# Patient Record
Sex: Male | Born: 2005 | Race: White | Hispanic: No | Marital: Single | Smoking: Never smoker
Health system: Southern US, Community
[De-identification: ages and names within clinical notes are randomized; demographics above are authoritative.]

## PROBLEM LIST (undated history)

## (undated) DIAGNOSIS — R17 Unspecified jaundice: Secondary | ICD-10-CM

## (undated) DIAGNOSIS — K5900 Constipation, unspecified: Secondary | ICD-10-CM

## (undated) DIAGNOSIS — Z8489 Family history of other specified conditions: Secondary | ICD-10-CM

## (undated) DIAGNOSIS — R109 Unspecified abdominal pain: Secondary | ICD-10-CM

## (undated) DIAGNOSIS — G43909 Migraine, unspecified, not intractable, without status migrainosus: Secondary | ICD-10-CM

## (undated) DIAGNOSIS — K921 Melena: Secondary | ICD-10-CM

## (undated) HISTORY — DX: Constipation, unspecified: K59.00

## (undated) HISTORY — DX: Unspecified jaundice: R17

---
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
## 2023-04-23 ENCOUNTER — Telehealth (INDEPENDENT_AMBULATORY_CARE_PROVIDER_SITE_OTHER): Payer: Self-pay | Admitting: Pediatrics

## 2023-04-23 DIAGNOSIS — G43001 Migraine without aura, not intractable, with status migrainosus: Secondary | ICD-10-CM

## 2023-04-23 NOTE — Telephone Encounter (Signed)
  Name of who is calling: Papania,Donna   Caller's Relationship to Patient: Earlie Lou contact number: [phone]   Provider they see: Carlyon Prows  Reason for call: patient's mother called requesting the following prescriptions be sent to [facility] in [place] on [address]     PRESCRIPTION REFILL ONLY  Name of prescription: Topiramate ER, Propranolol ER   Pharmacy: [facility]

## 2023-04-26 MED ORDER — TOPIRAMATE ER 100 MG PO SPRINKLE CAP24: 100.0000 mg | EXTENDED_RELEASE_CAPSULE | Freq: Every day | ORAL | 5 refills | Status: DC

## 2023-04-26 MED ORDER — PROPRANOLOL HCL ER 60 MG PO CP24
ORAL_CAPSULE | ORAL | 2 refills | Status: AC
Start: 2023-04-26 — End: ?

## 2023-04-26 NOTE — Telephone Encounter (Signed)
 Referral placed to adult neuro, [facility]. Please let mother know refills provided and location of referral up to date. Thanks!

## 2023-04-26 NOTE — Telephone Encounter (Signed)
 Patients mother called to inquire about referral. According to her she received a referral to a [place] neurologist. She refused that referral on the basis of the patient going to [employer] for college and requested a referral to be sent to the [facility] area which she alleges never receiving.

## 2023-04-27 ENCOUNTER — Encounter (INDEPENDENT_AMBULATORY_CARE_PROVIDER_SITE_OTHER): Payer: Self-pay

## 2023-11-27 ENCOUNTER — Other Ambulatory Visit (INDEPENDENT_AMBULATORY_CARE_PROVIDER_SITE_OTHER): Payer: Self-pay | Admitting: Pediatrics

## 2023-12-27 ENCOUNTER — Other Ambulatory Visit (INDEPENDENT_AMBULATORY_CARE_PROVIDER_SITE_OTHER): Payer: Self-pay | Admitting: Pediatrics

## 2024-01-07 ENCOUNTER — Other Ambulatory Visit (INDEPENDENT_AMBULATORY_CARE_PROVIDER_SITE_OTHER): Payer: Self-pay | Admitting: Pediatrics

## 2024-01-07 DIAGNOSIS — G43001 Migraine without aura, not intractable, with status migrainosus: Secondary | ICD-10-CM
